# Patient Record
Sex: Male | Born: 1965 | Race: White | Hispanic: No | Marital: Single | Smoking: Never smoker
Health system: Southern US, Community
[De-identification: ages and names within clinical notes are randomized; demographics above are authoritative.]

## PROBLEM LIST (undated history)

## (undated) DIAGNOSIS — K529 Noninfective gastroenteritis and colitis, unspecified: Secondary | ICD-10-CM

## (undated) DIAGNOSIS — N529 Male erectile dysfunction, unspecified: Secondary | ICD-10-CM

## (undated) DIAGNOSIS — G4700 Insomnia, unspecified: Secondary | ICD-10-CM

## (undated) HISTORY — PX: HERNIA REPAIR: SHX51

---

## 2011-07-02 ENCOUNTER — Ambulatory Visit: Payer: Self-pay

## 2012-12-20 ENCOUNTER — Ambulatory Visit: Payer: Self-pay

## 2012-12-20 LAB — RAPID INFLUENZA A&B ANTIGENS

## 2016-05-23 ENCOUNTER — Encounter: Payer: Self-pay | Admitting: *Deleted

## 2016-05-23 ENCOUNTER — Ambulatory Visit
Admission: EM | Admit: 2016-05-23 | Discharge: 2016-05-23 | Disposition: A | Discharge status: Home / Self Care | Payer: BLUE CROSS/BLUE SHIELD | Attending: Family Medicine | Admitting: Family Medicine

## 2016-05-23 ENCOUNTER — Ambulatory Visit (INDEPENDENT_AMBULATORY_CARE_PROVIDER_SITE_OTHER): Payer: BLUE CROSS/BLUE SHIELD

## 2016-05-23 DIAGNOSIS — K529 Noninfective gastroenteritis and colitis, unspecified: Secondary | ICD-10-CM | POA: Diagnosis not present

## 2016-05-23 HISTORY — DX: Noninfective gastroenteritis and colitis, unspecified: K52.9

## 2016-05-23 LAB — COMPREHENSIVE METABOLIC PANEL
ALT: 18 U/L (ref 17–63)
AST: 16 U/L (ref 15–41)
Albumin: 4.2 g/dL (ref 3.5–5.0)
Alkaline Phosphatase: 66 U/L (ref 38–126)
Anion gap: 7 (ref 5–15)
BUN: 14 mg/dL (ref 6–20)
CALCIUM: 9.2 mg/dL (ref 8.9–10.3)
CO2: 24 mmol/L (ref 22–32)
CREATININE: 1.16 mg/dL (ref 0.61–1.24)
Chloride: 105 mmol/L (ref 101–111)
GFR calc Af Amer: >60 mL/min (ref >60)
Glucose, Bld: 89 mg/dL (ref 65–99)
POTASSIUM: 4 mmol/L (ref 3.5–5.1)
Sodium: 136 mmol/L (ref 135–145)
TOTAL PROTEIN: 7.9 g/dL (ref 6.5–8.1)
Total Bilirubin: 0.9 mg/dL (ref 0.3–1.2)

## 2016-05-23 LAB — CBC WITH DIFFERENTIAL/PLATELET
BASOS ABS: 0.1 10*3/uL (ref 0–0.1)
Basophils Relative: 1 %
Eosinophils Absolute: 0.1 10*3/uL (ref 0–0.7)
HEMATOCRIT: 45.1 % (ref 40.0–52.0)
Hemoglobin: 15.4 g/dL (ref 13.0–18.0)
Lymphocytes Relative: 18 %
Lymphs Abs: 1.5 10*3/uL (ref 1.0–3.6)
MCH: 29.1 pg (ref 26.0–34.0)
MCHC: 34 g/dL (ref 32.0–36.0)
MCV: 85.6 fL (ref 80.0–100.0)
Monocytes Absolute: 0.5 10*3/uL (ref 0.2–1.0)
Monocytes Relative: 6 %
Neutro Abs: 6.4 10*3/uL (ref 1.4–6.5)
Neutrophils Relative %: 74 %
Platelets: 211 10*3/uL (ref 150–440)
RBC: 5.27 MIL/uL (ref 4.40–5.90)
RDW: 13.6 % (ref 11.5–14.5)
WBC: 8.5 10*3/uL (ref 3.8–10.6)

## 2016-05-23 MED ORDER — ONDANSETRON 8 MG PO TBDP: 8.0000 mg | ORAL_TABLET | Freq: Three times a day (TID) | ORAL | Status: DC | PRN

## 2016-05-23 NOTE — ED Notes (Signed)
Patient started having generalized abdominal pain this AM at 0400. Patient has an extensive GI problem history. Patient reports vomiting and diarrhea when he eats.

## 2016-05-23 NOTE — Discharge Instructions (Signed)

## 2016-07-06 NOTE — ED Provider Notes (Signed)
[facility]    CSN: [id number] Arrival date & time: 05/23/16  1402  First Provider Contact:  First MD Initiated Contact with Patient 05/23/16 1435        History   Chief Complaint Chief Complaint  Patient presents with  . Abdominal Pain    HPI Wallis MartGordon L Emmitt is a 50 y.o. male.    Abdominal Pain   Patient started having generalized abdominal pain this AM at 0400. Patient has an extensive GI problem history. Patient reports vomiting and diarrhea when he eats.      Denies any fevers, chills, hematemesis, melena, hematochezia. Diarrhea is watery.    Past Medical History:  Diagnosis Date  . Colitis     There are no active problems to display for this patient.   Past Surgical History:  Procedure Laterality Date  . HERNIA REPAIR     umbilical       Home Medications    Prior to Admission medications   Medication Sig Start Date End Date Taking? Authorizing Provider  ondansetron (ZOFRAN ODT) 8 MG disintegrating tablet Take 1 tablet (8 mg total) by mouth every 8 (eight) hours as needed for nausea or vomiting. 05/23/16   Payton Mccallumrlando Francis Doenges, MD    Family History Family History  Problem Relation Age of Onset  . Colitis Father     Social History Social History  Substance Use Topics  . Smoking status: Never Smoker  . Smokeless tobacco: Never Used  . Alcohol use No     Allergies   Review of patient's allergies indicates no known allergies.   Review of Systems Review of Systems  Gastrointestinal: Positive for abdominal pain.     Physical Exam Triage Vital Signs ED Triage Vitals  Enc Vitals Group     BP 05/23/16 1414 (!) 126/91     Pulse Rate 05/23/16 1414 69     Resp 05/23/16 1414 18     Temp 05/23/16 1414 98.2 F (36.8 C)     Temp Source 05/23/16 1414 Oral     SpO2 05/23/16 1414 99 %     Weight 05/23/16 1414 248 lb (112.5 kg)     Height 05/23/16 1414 6\' 5"  (1.956 m)     Head Circumference --      Peak Flow --      Pain Score 05/23/16  1424 5     Pain Loc --      Pain Edu? --      Excl. in GC? --    No data found.   Updated Vital Signs BP (!) 126/91 (BP Location: Left Arm)   Pulse 69   Temp 98.2 F (36.8 C) (Oral)   Resp 18   Ht 6\' 5"  (1.956 m)   Wt 248 lb (112.5 kg)   SpO2 99%   BMI 29.41 kg/m   Visual Acuity Right Eye Distance:   Left Eye Distance:   Bilateral Distance:    Right Eye Near:   Left Eye Near:    Bilateral Near:     Physical Exam  Constitutional: He is oriented to person, place, and time. He appears well-developed and well-nourished. No distress.  HENT:  Head: Normocephalic and atraumatic.  Cardiovascular: Normal rate, regular rhythm, normal heart sounds and intact distal pulses.   No murmur heard. Pulmonary/Chest: Effort normal and breath sounds normal. No respiratory distress. He has no wheezes. He has no rales.  Abdominal: Soft. Bowel sounds are normal. He exhibits no distension and no mass. There is  tenderness (mild, diffuse; no rebound or guarding). There is no rebound and no guarding.  Neurological: He is alert and oriented to person, place, and time.  Skin: No rash noted. He is not diaphoretic.  Nursing note and vitals reviewed.    UC Treatments / Results  Labs (all labs ordered are listed, but only abnormal results are displayed) Labs Reviewed  CBC WITH DIFFERENTIAL/PLATELET  COMPREHENSIVE METABOLIC PANEL    EKG  EKG Interpretation None       Radiology No results found.  Procedures Procedures (including critical care time)  Medications Ordered in UC Medications - No data to display   Initial Impression / Assessment and Plan / UC Course  I have reviewed the triage vital signs and the nursing notes.  Pertinent labs & imaging results that were available during my care of the patient were reviewed by me and considered in my medical decision making (see chart for details).  Clinical Course      Final Clinical Impressions(s) / UC Diagnoses   Final  diagnoses:  Gastroenteritis    New Prescriptions There are no discharge medications for this patient.  There are no discharge medications for this patient.  1. Lab results and diagnosis reviewed with patient 2. Recommend supportive treatment with clear liquids then advance slowly as tolerated  3. Follow-up prn if symptoms worsen or don't improve   Payton Mccallumrlando Nkenge Sonntag, MD 07/06/16 [phone]

## 2018-09-21 ENCOUNTER — Ambulatory Visit
Admission: EM | Admit: 2018-09-21 | Discharge: 2018-09-21 | Disposition: A | Discharge status: Home / Self Care | Payer: BLUE CROSS/BLUE SHIELD | Attending: Family Medicine | Admitting: Family Medicine

## 2018-09-21 ENCOUNTER — Encounter: Payer: Self-pay | Admitting: Emergency Medicine

## 2018-09-21 ENCOUNTER — Other Ambulatory Visit: Payer: Self-pay

## 2018-09-21 DIAGNOSIS — B9789 Other viral agents as the cause of diseases classified elsewhere: Secondary | ICD-10-CM

## 2018-09-21 DIAGNOSIS — J069 Acute upper respiratory infection, unspecified: Secondary | ICD-10-CM

## 2018-09-21 HISTORY — DX: Male erectile dysfunction, unspecified: N52.9

## 2018-09-21 HISTORY — DX: Insomnia, unspecified: G47.00

## 2018-09-21 MED ORDER — HYDROCOD POLST-CPM POLST ER 10-8 MG/5ML PO SUER: 5.0000 mL | Freq: Two times a day (BID) | ORAL | 0 refills | Status: DC | PRN

## 2018-09-21 NOTE — ED Provider Notes (Signed)
[facility]  CSN: [id number] Arrival date & time: 09/21/18  1147  History   Chief Complaint Chief Complaint  Patient presents with  . Cough   HPI  52 year old male presents with respiratory symptoms.  Started on Sunday.  Started with sore throat.  Sore throat has now resolved.  He has been experiencing additional symptoms: Runny nose, cough, fatigue.  He is particularly bothered by the cough and fatigue.  Associated body aches.  Has felt feverish.  He has not taken his temperature.  He has been seen by [facility].  Had negative flu testing.  He has been given promethazine cough syrup without improvement.  Worse at night.  No other complaints.  PMH, Surgical Hx, Family Hx, Social History reviewed and updated as below.  Past Medical History:  Diagnosis Date  . Colitis   . ED (erectile dysfunction)   . Insomnia    Past Surgical History:  Procedure Laterality Date  . HERNIA REPAIR     umbilical   Family History Family History  Problem Relation Age of Onset  . Colitis Father   . Hypertension Mother    Social History Social History   Tobacco Use  . Smoking status: Never Smoker  . Smokeless tobacco: Never Used  Substance Use Topics  . Alcohol use: No  . Drug use: No   Allergies   Patient has no known allergies.   Review of Systems Review of Systems Per HPI  Physical Exam Triage Vital Signs ED Triage Vitals  Enc Vitals Group     BP 09/21/18 1218 (!) 131/95     Pulse Rate 09/21/18 1218 (!) 104     Resp 09/21/18 1218 16     Temp 09/21/18 1218 98.7 F (37.1 C)     Temp Source 09/21/18 1218 Oral     SpO2 09/21/18 1218 98 %     Weight 09/21/18 1218 252 lb (114.3 kg)     Height 09/21/18 1218 6\' 5"  (1.956 m)     Head Circumference --      Peak Flow --      Pain Score 09/21/18 1217 5     Pain Loc --      Pain Edu? --      Excl. in GC? --    Updated Vital Signs BP (!) 131/95 (BP Location: Left Arm)   Pulse (!) 104   Temp 98.7 F (37.1 C)  (Oral)   Resp 16   Ht 6\' 5"  (1.956 m)   Wt 114.3 kg   SpO2 98%   BMI 29.88 kg/m   Visual Acuity Right Eye Distance:   Left Eye Distance:   Bilateral Distance:    Right Eye Near:   Left Eye Near:    Bilateral Near:     Physical Exam  Constitutional: He is oriented to person, place, and time. He appears well-developed. No distress.  HENT:  Head: Normocephalic and atraumatic.  Mouth/Throat: Oropharynx is clear and moist.  Cardiovascular: Normal rate and regular rhythm.  Pulmonary/Chest: Effort normal and breath sounds normal. He has no wheezes. He has no rales.  Neurological: He is alert and oriented to person, place, and time.  Psychiatric: He has a normal mood and affect. His behavior is normal.  Nursing note and vitals reviewed.   UC Treatments / Results  Labs (all labs ordered are listed, but only abnormal results are displayed) Labs Reviewed - No data to display  EKG None  Radiology No results found.  Procedures  Procedures (including critical care time)  Medications Ordered in UC Medications - No data to display  Initial Impression / Assessment and Plan / UC Course  I have reviewed the triage vital signs and the nursing notes.  Pertinent labs & imaging results that were available during my care of the patient were reviewed by me and considered in my medical decision making (see chart for details).    52 year old male presents with viral URI with cough.  Treating with Tussionex.    Final Clinical Impressions(s) / UC Diagnoses   Final diagnoses:  Viral URI with cough   Discharge Instructions   None    ED Prescriptions    Medication Sig Dispense Auth. Provider   chlorpheniramine-HYDROcodone (TUSSIONEX PENNKINETIC ER) 10-8 MG/5ML SUER Take 5 mLs by mouth every 12 (twelve) hours as needed. 112 mL Tommie Sams, DO     Controlled Substance Prescriptions [place] Controlled Substance Registry consulted? Not Applicable   Tommie Sams, DO 09/21/18 4098

## 2018-09-21 NOTE — ED Triage Notes (Signed)
Patient in today c/o cough, headache, body aches and fatigue x 4 days. Patient has felt feverish, but hasn't taken his temperature. [facility] ([facility]/19/18) and given prescription for Promethazine DM Syrup which is not helping.

## 2019-08-27 ENCOUNTER — Other Ambulatory Visit: Payer: Self-pay

## 2019-08-27 DIAGNOSIS — Z20822 Contact with and (suspected) exposure to covid-19: Secondary | ICD-10-CM

## 2019-08-29 LAB — NOVEL CORONAVIRUS, NAA: SARS-CoV-2, NAA: NOT DETECTED

## 2019-11-09 ENCOUNTER — Ambulatory Visit: Payer: BC Managed Care – PPO | Attending: Internal Medicine

## 2019-11-09 ENCOUNTER — Other Ambulatory Visit: Payer: Self-pay

## 2019-11-09 DIAGNOSIS — Z20822 Contact with and (suspected) exposure to covid-19: Secondary | ICD-10-CM

## 2019-11-11 LAB — NOVEL CORONAVIRUS, NAA: SARS-CoV-2, NAA: NOT DETECTED

## 2020-04-08 ENCOUNTER — Other Ambulatory Visit: Payer: Self-pay

## 2020-04-08 ENCOUNTER — Ambulatory Visit (INDEPENDENT_AMBULATORY_CARE_PROVIDER_SITE_OTHER): Payer: BC Managed Care – PPO

## 2020-04-08 ENCOUNTER — Encounter: Payer: Self-pay | Admitting: Emergency Medicine

## 2020-04-08 ENCOUNTER — Ambulatory Visit
Admission: EM | Admit: 2020-04-08 | Discharge: 2020-04-08 | Disposition: A | Discharge status: Home / Self Care | Payer: BC Managed Care – PPO

## 2020-04-08 DIAGNOSIS — M7661 Achilles tendinitis, right leg: Secondary | ICD-10-CM

## 2020-04-08 DIAGNOSIS — M79671 Pain in right foot: Secondary | ICD-10-CM

## 2020-04-08 MED ORDER — PREDNISONE 20 MG PO TABS: 40.0000 mg | ORAL_TABLET | Freq: Every day | ORAL | 0 refills | Status: DC

## 2020-04-08 MED ORDER — TRAMADOL HCL 50 MG PO TABS: 50.0000 mg | ORAL_TABLET | Freq: Three times a day (TID) | ORAL | 0 refills | Status: DC | PRN

## 2020-04-08 NOTE — ED Triage Notes (Signed)
Patient c/o right foot pain that started about 1 week ago. Denies injury.

## 2020-04-08 NOTE — ED Provider Notes (Signed)
[place]   Name: Arthur Marshall DOB: [birth year] MRN: [id number] CSN: [id number] PCP: Marina Goodell, MD  Arrival date and time:  04/08/20 1244  Chief Complaint:  Foot Pain  NOTE: Prior to seeing the patient today, I have reviewed the triage nursing documentation and vital signs. Clinical staff has updated patient's PMH/PSHx, current medication list, and drug allergies/intolerances to ensure comprehensive history available to assist in medical decision making.   History:   HPI: Arthur Marshall is a 54 y.o. male who presents today with complaints of pain in his RIGHT foot. Patient denies any known injury. Pain has worsened since onset despite use of IBU. Minimal pain at rest; intensifies with weight bearing. Majority of pain is overlying the ATFL/AiTFL and posterior calcaneous. Patient notes that pain extends proximally into the Achilles area. He has never experienced pain like this in the past. He has been soaking the foot in warm Epson salt water with no improvement.   Past Medical History:  Diagnosis Date  . Colitis   . ED (erectile dysfunction)   . Insomnia     Past Surgical History:  Procedure Laterality Date  . HERNIA REPAIR     umbilical    Family History  Problem Relation Age of Onset  . Colitis Father   . Hypertension Mother     Social History   Tobacco Use  . Smoking status: Never Smoker  . Smokeless tobacco: Never Used  Substance Use Topics  . Alcohol use: No  . Drug use: No    There are no problems to display for this patient.   Home Medications:    Current Meds  Medication Sig  . sertraline (ZOLOFT) 25 MG tablet Take 25 mg by mouth daily.  Marland Kitchen zolpidem (AMBIEN) 10 MG tablet TK 1 T PO Q NIGHT PRF SLEEP    Allergies:   Patient has no known allergies.  Review of Systems (ROS):  Review of systems NEGATIVE unless otherwise noted in narrative H&P section.   Vital Signs: Today's Vitals   04/08/20 1255 04/08/20 1256 04/08/20 1257  BP:    (!) 142/96  Pulse:   71  Resp:   18  Temp:   98.5 F (36.9 C)  TempSrc:   Oral  SpO2:   98%  Weight:  270 lb (122.5 kg)   Height:  6\' 5"  (1.956 m)   PainSc: 7       Physical Exam: Physical Exam  Constitutional: He is oriented to person, place, and time and well-developed, well-nourished, and in no distress.  HENT:  Head: Normocephalic and atraumatic.  Eyes: Pupils are equal, round, and reactive to light.  Cardiovascular: Normal rate and intact distal pulses.  Pulmonary/Chest: Effort normal. No respiratory distress.  Musculoskeletal:     Right ankle: No swelling or deformity. Tenderness present over the AITF ligament. Normal range of motion. Normal pulse.     Right Achilles Tendon: Pain present.     Right foot: Normal capillary refill. Tenderness present. No swelling, deformity or laceration.       Feet:  Neurological: He is alert and oriented to person, place, and time. He has normal sensation, normal strength and normal reflexes. Gait (2/2 acute pain) abnormal.  Skin: Skin is warm and dry. No rash noted. He is not diaphoretic.  Psychiatric: Mood, memory, affect and judgment normal.  Nursing note and vitals reviewed.   Urgent Care Treatments / Results:   Orders Placed This Encounter  Procedures  . DG Foot Complete  Right    LABS: PLEASE NOTE: all labs that were ordered this encounter are listed, however only abnormal results are displayed. Labs Reviewed - No data to display  EKG: -None  RADIOLOGY: DG Foot Complete Right  Result Date: 04/08/2020 CLINICAL DATA:  Acute right foot pain without known injury. EXAM: RIGHT FOOT COMPLETE - 3+ VIEW COMPARISON:  None. FINDINGS: There is no evidence of fracture or dislocation. There is no evidence of arthropathy or other focal bone abnormality. Soft tissues are unremarkable. IMPRESSION: Negative. Electronically Signed   By: Lupita Raider M.D.   On: 04/08/2020 13:26    PROCEDURES: Procedures  MEDICATIONS RECEIVED THIS  VISIT: Medications - No data to display  PERTINENT CLINICAL COURSE NOTES/UPDATES:   Initial Impression / Assessment and Plan / Urgent Care Course:  Pertinent labs & imaging results that were available during my care of the patient were personally reviewed by me and considered in my medical decision making (see lab/imaging section of note for values and interpretations).  Arthur Marshall is a 54 y.o. male who presents to [facility] today with complaints of Foot Pain  Patient is well appearing overall in clinic today. He does not appear to be in any acute distress. Presenting symptoms (see HPI) and exam as documented above. Diagnostic plain films (-) for acute osseous abnormalities. Based on exam, concern for insertional enthesitis (Achilles tendonitis). Treating with systemic steroid burst (prednisone 40 mg daily x 5 days) and Tramadol. Reviewed supportive care measures; rest, ice, elevation, and PRN use of APAP. Patient requesting boot for stability/comfort/support. Patient wears a size 14 (double wide). We do not have the DME available to accommodate his sizing in clinic; written Rx provided.   Current clinical condition warrants patient being out of work in order to recover from his current injury/illness. He was provided with the appropriate documentation to provide to his place of employment that will allow for him to RTW on 04/12/2020 with no restrictions.   Discussed follow up with primary care physician in 1 week for re-evaluation. I have reviewed the follow up and strict return precautions for any new or worsening symptoms. Patient is aware of symptoms that would be deemed urgent/emergent, and would thus require further evaluation either here or in the emergency department. At the time of discharge, he verbalized understanding and consent with the discharge plan as it was reviewed with him. All questions were fielded by provider and/or clinic staff prior to patient discharge.     Final Clinical Impressions / Urgent Care Diagnoses:   Final diagnoses:  Tendonitis, Achilles, right  Foot pain, right    New Prescriptions:  [place] Controlled Substance Registry consulted? Not Applicable  Meds ordered this encounter  Medications  . predniSONE (DELTASONE) 20 MG tablet    Sig: Take 2 tablets (40 mg total) by mouth daily.    Dispense:  10 tablet    Refill:  0  . traMADol (ULTRAM) 50 MG tablet    Sig: Take 1 tablet (50 mg total) by mouth 3 (three) times daily as needed.    Dispense:  15 tablet    Refill:  0    Recommended Follow up Care:  Patient encouraged to follow up with the following provider within the specified time frame, or sooner as dictated by the severity of his symptoms. As always, he was instructed that for any urgent/emergent care needs, he should seek care either here or in the emergency department for more immediate evaluation.  Follow-up Information  Sofie Hartigan, MD In 1 week.   Specialty: Family Medicine Why: General reassessment of symptoms if not improving Contact information: [place] [id number] [phone]         NOTE: This note was prepared using Dragon dictation software along with smaller phrase technology. Despite my best ability to proofread, there is the potential that transcriptional errors may still occur from this process, and are completely unintentional.    Karen Kitchens, NP 04/08/20 1352

## 2020-04-08 NOTE — Discharge Instructions (Addendum)
It was very nice seeing you today in clinic. Thank you for entrusting me with your care.   Rest, ice, and elevate your foot/ankle. Use steroids and pain medications as prescribed.   Make arrangements to follow up with your regular doctor in 1 week for re-evaluation if not improving. If your symptoms/condition worsens, please seek follow up care either here or in the ER. Please remember, our [facility] providers are "right here with you" when you need [place].   Again, it was my pleasure to take care of you today. Thank you for choosing our clinic. I hope that you start to feel better quickly.   Quentin Mulling, MSN, APRN, FNP-C, CEN Advanced Practice Provider [facility]

## 2020-09-01 ENCOUNTER — Ambulatory Visit
Admission: EM | Admit: 2020-09-01 | Discharge: 2020-09-01 | Disposition: A | Discharge status: Home / Self Care | Payer: BC Managed Care – PPO | Attending: Emergency Medicine | Admitting: Emergency Medicine

## 2020-09-01 ENCOUNTER — Encounter: Payer: Self-pay | Admitting: Emergency Medicine

## 2020-09-01 ENCOUNTER — Other Ambulatory Visit: Payer: Self-pay

## 2020-09-01 DIAGNOSIS — L0291 Cutaneous abscess, unspecified: Secondary | ICD-10-CM | POA: Diagnosis not present

## 2020-09-01 MED ORDER — SULFAMETHOXAZOLE-TRIMETHOPRIM 800-160 MG PO TABS: 1.0000 | ORAL_TABLET | Freq: Two times a day (BID) | ORAL | 0 refills | Status: AC

## 2020-09-01 MED ORDER — CEPHALEXIN 500 MG PO CAPS: 500.0000 mg | ORAL_CAPSULE | Freq: Four times a day (QID) | ORAL | 0 refills | Status: DC

## 2020-09-01 MED ORDER — LIDOCAINE HCL 1 % IJ SOLN
5.0000 mL | Freq: Once | INTRAMUSCULAR | Status: AC
  Administered 2020-09-01: 5 mL

## 2020-09-01 NOTE — ED Provider Notes (Signed)
Emergency Department Provider Note  ____________________________________________  Time seen: Approximately 1:10 PM  I have reviewed the triage vital signs and the nursing notes.   HISTORY  Chief Complaint Abscess   Historian Patient     HPI Arthur Marshall is a 54 y.o. male presents to the emergency department with a 3 cm x 2 cm circumferential region of erythema and induration along the right knee that started 4 days ago.  Patient has tried needle aspiration at home with no success.  He denies fever and chills and has had full range of motion of the knee.  No other alleviating measures have been attempted.   Past Medical History:  Diagnosis Date  . Colitis   . ED (erectile dysfunction)   . Insomnia      Immunizations up to date:  Yes.     Past Medical History:  Diagnosis Date  . Colitis   . ED (erectile dysfunction)   . Insomnia     There are no problems to display for this patient.   Past Surgical History:  Procedure Laterality Date  . HERNIA REPAIR     umbilical    Prior to Admission medications   Medication Sig Start Date End Date Taking? Authorizing Provider  sertraline (ZOLOFT) 25 MG tablet Take 25 mg by mouth daily. 03/10/20  Yes [provider]  zolpidem (AMBIEN) 10 MG tablet TK 1 T PO Q NIGHT PRF SLEEP 08/17/18  Yes [provider]  cephALEXin (KEFLEX) 500 MG capsule Take 1 capsule (500 mg total) by mouth 4 (four) times daily. 09/01/20   Orvil Feil, PA-C  predniSONE (DELTASONE) 20 MG tablet Take 2 tablets (40 mg total) by mouth daily. 04/08/20   Verlee Monte, NP  sulfamethoxazole-trimethoprim (BACTRIM DS) 800-160 MG tablet Take 1 tablet by mouth 2 (two) times daily for 7 days. 09/01/20 09/08/20  Orvil Feil, PA-C  traMADol (ULTRAM) 50 MG tablet Take 1 tablet (50 mg total) by mouth 3 (three) times daily as needed. 04/08/20   Verlee Monte, NP  vardenafil (LEVITRA) 20 MG tablet Take by mouth. 01/10/15 04/08/20  [provider]    Allergies Patient has no known allergies.  Family History  Problem Relation Age of Onset  . Colitis Father   . Hypertension Mother     Social History Social History   Tobacco Use  . Smoking status: Never Smoker  . Smokeless tobacco: Never Used  Vaping Use  . Vaping Use: Never used  Substance Use Topics  . Alcohol use: No  . Drug use: No     Review of Systems  Constitutional: No fever/chills Eyes:  No discharge ENT: No upper respiratory complaints. Respiratory: no cough. No SOB/ use of accessory muscles to breath Gastrointestinal:   No nausea, no vomiting.  No diarrhea.  No constipation. Musculoskeletal: Negative for musculoskeletal pain. Skin: Patient has a 3 cm x 2 cm region of circumferential erythema overlying the right knee with associated induration.   ____________________________________________   PHYSICAL EXAM:  VITAL SIGNS: ED Triage Vitals  Enc Vitals Group     BP 09/01/20 1046 126/89     Pulse Rate 09/01/20 1046 70     Resp 09/01/20 1046 18     Temp 09/01/20 1046 98.7 F (37.1 C)     Temp Source 09/01/20 1046 Oral     SpO2 09/01/20 1046 97 %     Weight 09/01/20 1043 270 lb 1 oz (122.5 kg)     Height  09/01/20 1043 6\' 5"  (1.956 m)     Head Circumference --      Peak Flow --      Pain Score 09/01/20 1043 2     Pain Loc --      Pain Edu? --      Excl. in GC? --      Constitutional: Alert and oriented. Well appearing and in no acute distress. Eyes: Conjunctivae are normal. PERRL. EOMI. Head: Atraumatic. Cardiovascular: Normal rate, regular rhythm. Normal S1 and S2.  Good peripheral circulation. Respiratory: Normal respiratory effort without tachypnea or retractions. Lungs CTAB. Good air entry to the bases with no decreased or absent breath sounds Gastrointestinal: Bowel sounds x 4 quadrants. Soft and nontender to palpation. No guarding or rigidity. No distention. Musculoskeletal: Full range of motion to all extremities. No  obvious deformities noted Neurologic:  Normal for age. No gross focal neurologic deficits are appreciated.  Skin: Patient has a 3 cm x 2 cm region of circumferential erythema overlying the superior aspect of the right knee with associated induration but no fluctuance. Psychiatric: Mood and affect are normal for age. Speech and behavior are normal.   ____________________________________________   LABS (all labs ordered are listed, but only abnormal results are displayed)  Labs Reviewed - No data to display ____________________________________________  EKG   ____________________________________________  RADIOLOGY   No results found.  ____________________________________________    PROCEDURES  Procedure(s) performed:     Incision and Drainage  Date/Time: 09/01/2020 1:12 PM Performed by: 11/01/2020, PA-C Authorized by: Orvil Feil, PA-C   Consent:    Consent obtained:  Verbal   Consent given by:  Patient   Risks discussed:  Bleeding, incomplete drainage, pain and damage to other organs   Alternatives discussed:  No treatment Universal protocol:    Procedure explained and questions answered to patient or proxy's satisfaction: yes     Relevant documents present and verified: yes     Test results available and properly labeled: yes     Imaging studies available: yes     Required blood products, implants, devices, and special equipment available: yes     Site/side marked: yes     Immediately prior to procedure a time out was called: yes     Patient identity confirmed:  Verbally with patient Location:    Type:  Abscess Pre-procedure details:    Skin preparation:  Betadine Anesthesia (see MAR for exact dosages):    Anesthesia method:  Local infiltration   Local anesthetic:  Lidocaine 1% WITH epi Procedure type:    Complexity:  Complex Procedure details:    Incision types:  Single straight   Incision depth:  Subcutaneous   Scalpel blade:  11   Wound  management:  Probed and deloculated, irrigated with saline and extensive cleaning   Drainage:  Purulent   Drainage amount:  Moderate   Packing materials:  1/4 in gauze Post-procedure details:    Patient tolerance of procedure:  Tolerated well, no immediate complications       Medications  lidocaine (XYLOCAINE) 1 % (with pres) injection 5 mL (5 mLs Infiltration Given 09/01/20 1207)     ____________________________________________   INITIAL IMPRESSION / ASSESSMENT AND PLAN / ED COURSE  Pertinent labs & imaging results that were available during my care of the patient were reviewed by me and considered in my medical decision making (see chart for details).      Assessment and Plan: Cellulitis Abscess 54 year old male presents to the  emergency department with circumferential erythema overlying the superior aspect of the right knee. Vital signs were reassuring at triage.  On physical exam, patient was alert, active and nontoxic-appearing.  He underwent incision and drainage without complication.  A copious amount of purulent drainage was expressed from wound site and a small amount of packing was placed.  Patient was advised to remove packing after 24 hours.  He was discharged with Bactrim and Keflex.  Return precautions were given to return with new or worsening symptoms.     ____________________________________________  FINAL CLINICAL IMPRESSION(S) / ED DIAGNOSES  Final diagnoses:  Abscess      NEW MEDICATIONS STARTED DURING THIS VISIT:  ED Discharge Orders         Ordered    sulfamethoxazole-trimethoprim (BACTRIM DS) 800-160 MG tablet  2 times daily        09/01/20 1224    cephALEXin (KEFLEX) 500 MG capsule  4 times daily        09/01/20 1224              This chart was dictated using voice recognition software/Dragon. Despite best efforts to proofread, errors can occur which can change the meaning. Any change was purely unintentional.     Orvil Feil, PA-C 09/01/20 1313

## 2020-09-01 NOTE — ED Triage Notes (Signed)
Pt has an abscess  on his right knee. Started about 4 days ago. He has tried ice and tried to puncture the area but unable to get anything out of it.

## 2020-09-01 NOTE — Discharge Instructions (Addendum)
Remove packing after 24 hours. Take bactrim twice daily for the next seven days.  Take Keflex 4 times daily for the next seven days.  If you experiencing worsening swelling or redness, please seek care at local emergency department.

## 2021-02-14 ENCOUNTER — Ambulatory Visit
Admission: EM | Admit: 2021-02-14 | Discharge: 2021-02-14 | Disposition: A | Discharge status: Home / Self Care | Payer: BC Managed Care – PPO | Attending: Family Medicine | Admitting: Family Medicine

## 2021-02-14 ENCOUNTER — Other Ambulatory Visit: Payer: Self-pay

## 2021-02-14 DIAGNOSIS — J069 Acute upper respiratory infection, unspecified: Secondary | ICD-10-CM | POA: Diagnosis not present

## 2021-02-14 MED ORDER — IPRATROPIUM BROMIDE 0.06 % NA SOLN: 2.0000 | Freq: Four times a day (QID) | NASAL | 0 refills | Status: AC | PRN

## 2021-02-14 MED ORDER — PROMETHAZINE-DM 6.25-15 MG/5ML PO SYRP: 5.0000 mL | ORAL_SOLUTION | Freq: Four times a day (QID) | ORAL | 0 refills | Status: AC | PRN

## 2021-02-14 NOTE — ED Provider Notes (Signed)
[facility]    CSN: [id number] Arrival date & time: 02/14/21  0263      History   Chief Complaint Chief Complaint  Patient presents with  . Nasal Congestion  . Cough   HPI  55 year old male presents with the above complaints.  Patient reports that his symptoms started on Wednesday.  He states that he felt like it was allergies and he was doing fine until last night.  He states that he has had cough, runny nose.  Some congestion.  He has had some posttussive emesis.  No fever.  Cough is his predominant symptom and is quite troublesome.  No reported sick contacts.  No other reported symptoms.  He has had no relief with over-the-counter treatment.  Past Medical History:  Diagnosis Date  . Colitis   . ED (erectile dysfunction)   . Insomnia    Past Surgical History:  Procedure Laterality Date  . HERNIA REPAIR     umbilical    Home Medications    Prior to Admission medications   Medication Sig Start Date End Date Taking? Authorizing Provider  ipratropium (ATROVENT) 0.06 % nasal spray Place 2 sprays into both nostrils 4 (four) times daily as needed for rhinitis. 02/14/21  Yes Naelle Diegel G, DO  promethazine-dextromethorphan (PROMETHAZINE-DM) 6.25-15 MG/5ML syrup Take 5 mLs by mouth 4 (four) times daily as needed for cough. 02/14/21  Yes Helyne Genther G, DO  sertraline (ZOLOFT) 25 MG tablet Take 25 mg by mouth daily. 03/10/20  Yes [provider]  zolpidem (AMBIEN) 10 MG tablet TK 1 T PO Q NIGHT PRF SLEEP 08/17/18  Yes [provider]  vardenafil (LEVITRA) 20 MG tablet Take by mouth. 01/10/15 04/08/20  [provider]    Family History Family History  Problem Relation Age of Onset  . Colitis Father   . Hypertension Mother     Social History Social History   Tobacco Use  . Smoking status: Never Smoker  . Smokeless tobacco: Never Used  Vaping Use  . Vaping Use: Never used  Substance Use Topics  . Alcohol use: No  . Drug use: No      Allergies   Patient has no known allergies.   Review of Systems Review of Systems Per HPI  Physical Exam Triage Vital Signs ED Triage Vitals  Enc Vitals Group     BP 02/14/21 0908 125/84     Pulse Rate 02/14/21 0908 85     Resp 02/14/21 0908 18     Temp 02/14/21 0908 99.5 F (37.5 C)     Temp Source 02/14/21 0908 Oral     SpO2 02/14/21 0908 98 %     Weight 02/14/21 0906 270 lb (122.5 kg)     Height 02/14/21 0906 6\' 5"  (1.956 m)     Head Circumference --      Peak Flow --      Pain Score 02/14/21 0906 0     Pain Loc --      Pain Edu? --      Excl. in GC? --    Updated Vital Signs BP 125/84 (BP Location: Left Arm)   Pulse 85   Temp 99.5 F (37.5 C) (Oral)   Resp 18   Ht 6\' 5"  (1.956 m)   Wt 122.5 kg   SpO2 98%   BMI 32.02 kg/m   Visual Acuity Right Eye Distance:   Left Eye Distance:   Bilateral Distance:    Right Eye Near:  Left Eye Near:    Bilateral Near:     Physical Exam Vitals and nursing note reviewed.  Constitutional:      General: He is not in acute distress.    Appearance: Normal appearance. He is not ill-appearing.  HENT:     Head: Normocephalic and atraumatic.     Nose: Congestion present.  Eyes:     General:        Right eye: No discharge.        Left eye: No discharge.     Conjunctiva/sclera: Conjunctivae normal.  Cardiovascular:     Rate and Rhythm: Normal rate and regular rhythm.  Pulmonary:     Effort: Pulmonary effort is normal.     Breath sounds: Normal breath sounds. No wheezing, rhonchi or rales.  Neurological:     Mental Status: He is alert.  Psychiatric:        Mood and Affect: Mood normal.        Behavior: Behavior normal.    UC Treatments / Results  Labs (all labs ordered are listed, but only abnormal results are displayed) Labs Reviewed - No data to display  EKG   Radiology No results found.  Procedures Procedures (including critical care time)  Medications Ordered in UC Medications - No data to  display  Initial Impression / Assessment and Plan / UC Course  I have reviewed the triage vital signs and the nursing notes.  Pertinent labs & imaging results that were available during my care of the patient were reviewed by me and considered in my medical decision making (see chart for details).    55 year old male presents with a viral URI with cough.  Atrovent and Promethazine-DM as prescribed. Supportive care.   Final Clinical Impressions(s) / UC Diagnoses   Final diagnoses:  Viral URI with cough     Discharge Instructions     Rest.  Lots of fluids.  Over the counter antihistamine.  Medications as prescribed.  Take care  Dr. Adriana Simas     ED Prescriptions    Medication Sig Dispense Auth. Provider   ipratropium (ATROVENT) 0.06 % nasal spray Place 2 sprays into both nostrils 4 (four) times daily as needed for rhinitis. 15 mL Jayan Raymundo G, DO   promethazine-dextromethorphan (PROMETHAZINE-DM) 6.25-15 MG/5ML syrup Take 5 mLs by mouth 4 (four) times daily as needed for cough. 118 mL Tommie Sams, DO     PDMP not reviewed this encounter.   Tommie Sams, DO 02/14/21 1020

## 2021-02-14 NOTE — Discharge Instructions (Addendum)
Rest.  Lots of fluids.  Over the counter antihistamine.  Medications as prescribed.  Take care  Dr. Adriana Simas

## 2021-02-14 NOTE — ED Triage Notes (Signed)
Pt c/o nasal congestion, cough, emesis x3 since last night that seems to be from all the mucus. Pt states he feels better after vomiting. Pt states cough is constant. Pt denies fever, chills, body aches.

## 2021-11-09 IMAGING — CR DG FOOT COMPLETE 3+V*R*
3 series · 3 of 3 positions shown · non-contrast
Comparison: None.

CLINICAL DATA: Acute right foot pain without known injury.

EXAM:
RIGHT FOOT COMPLETE - 3+ VIEW

[foot ap]
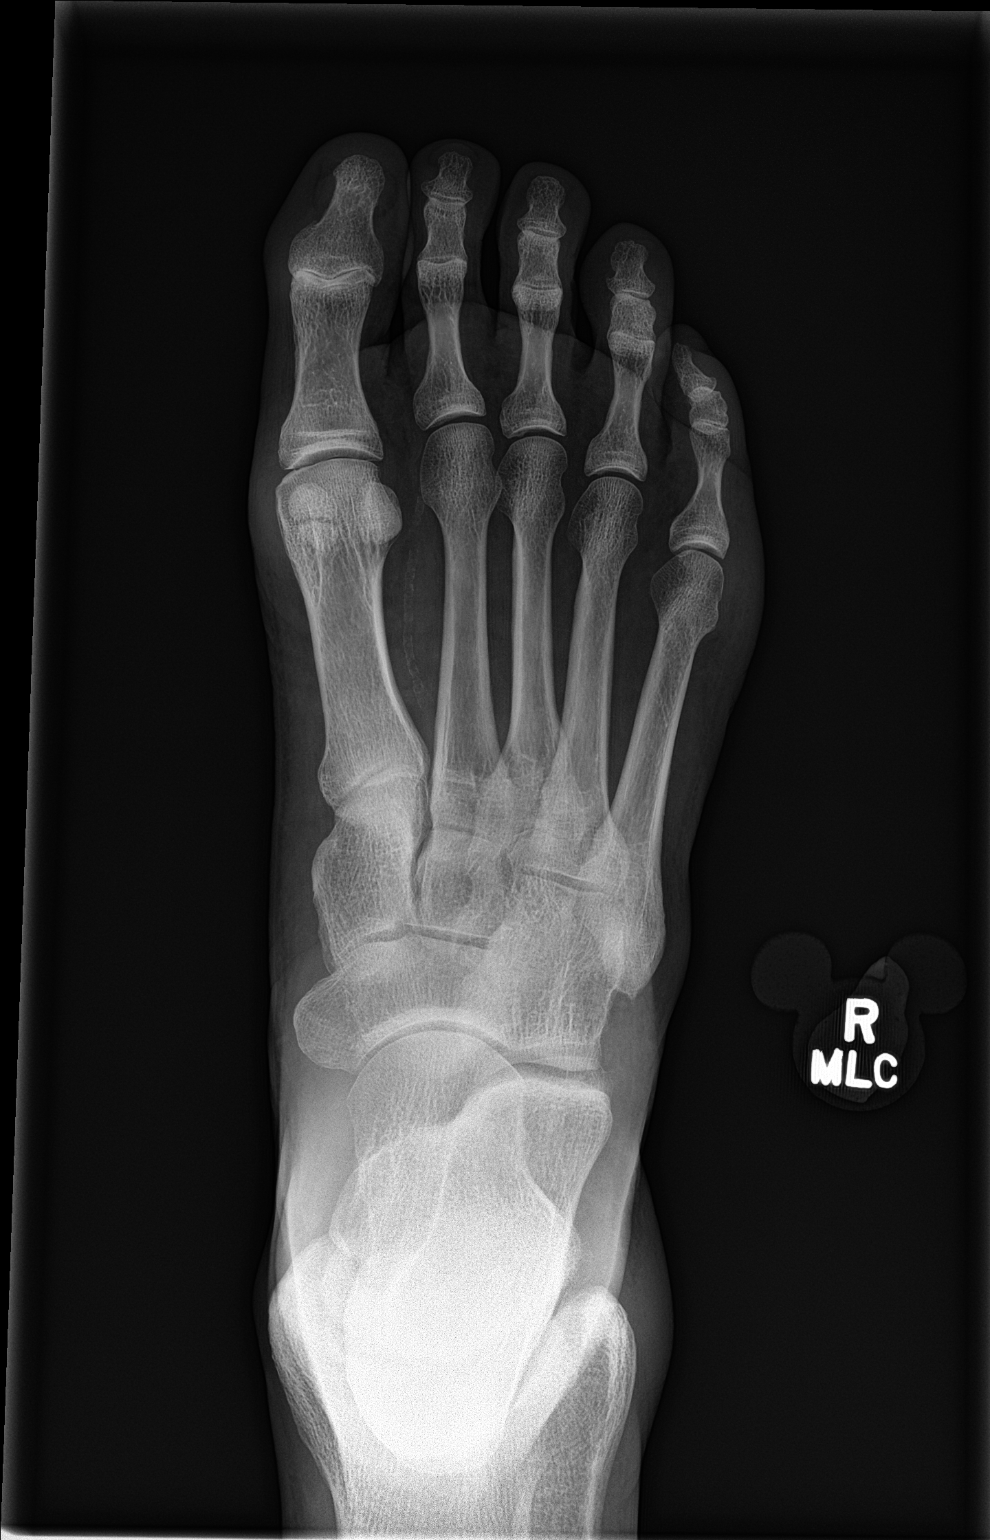

[foot obl]
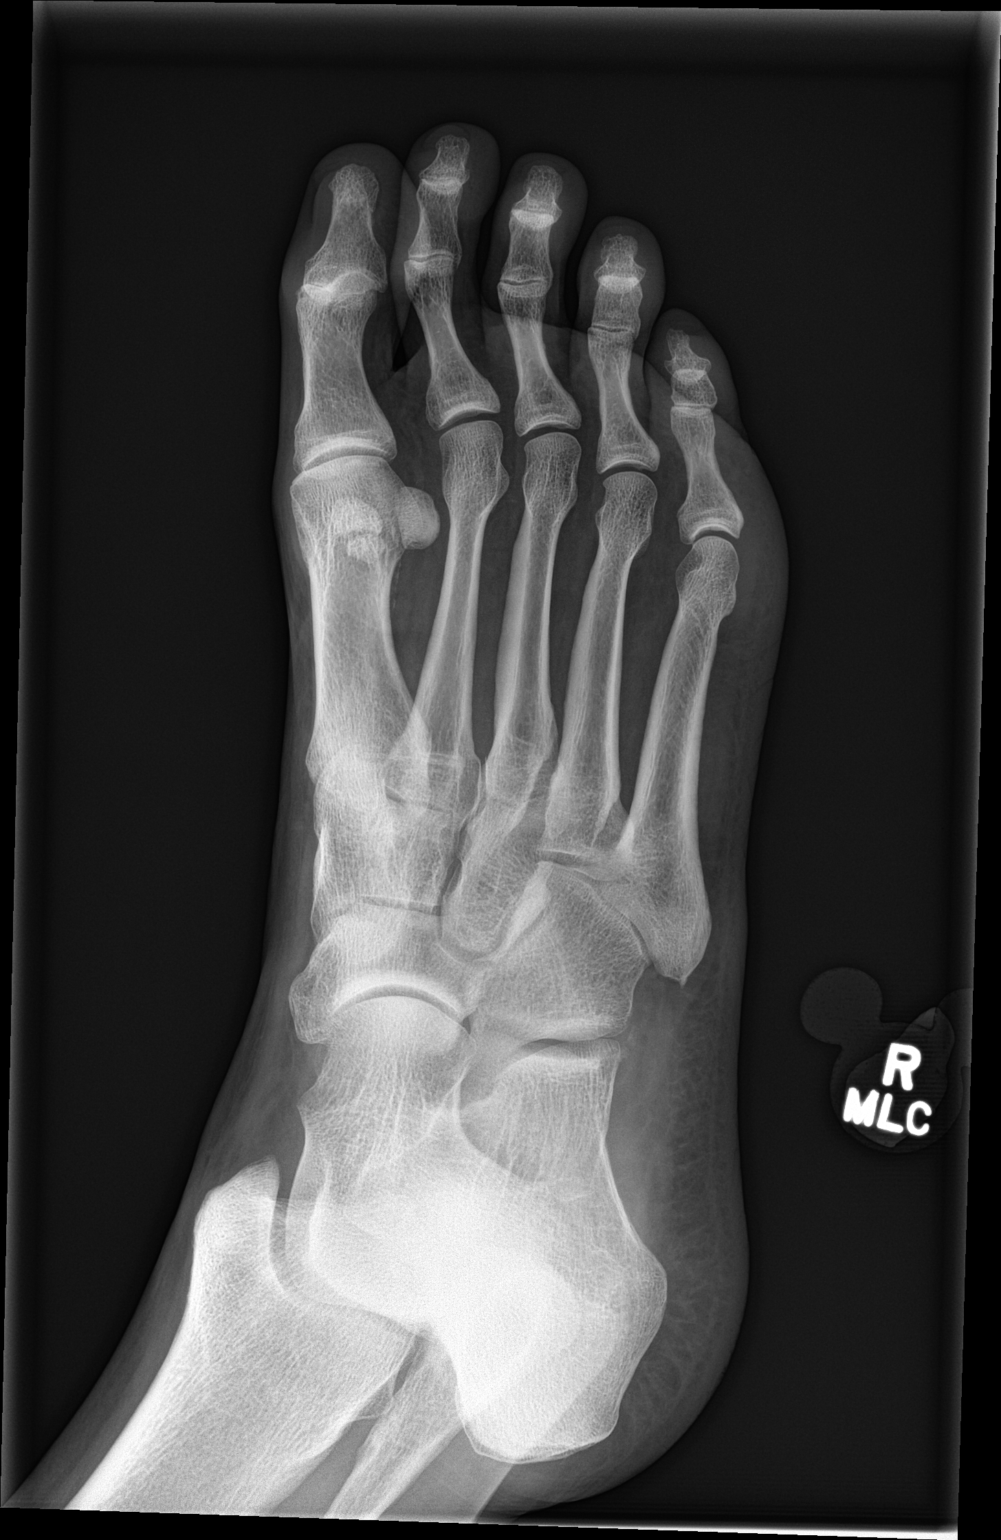

[foot lat]
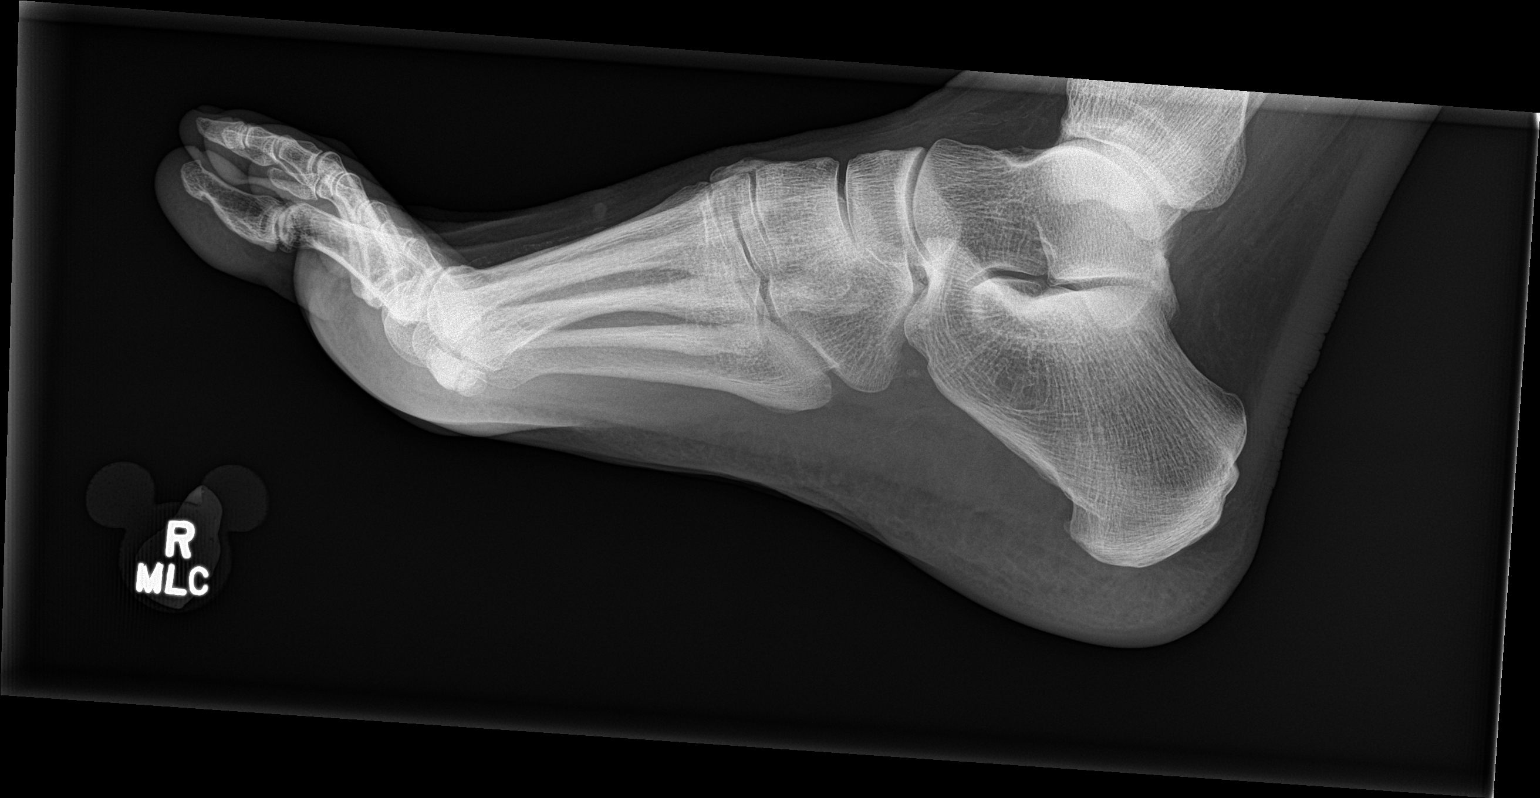

[3 of 3 positions shown; findings below may reference images not displayed]

FINDINGS: There is no evidence of fracture or dislocation. There is no
evidence of arthropathy or other focal bone abnormality. Soft
tissues are unremarkable.
IMPRESSION: Negative.
